# Patient Record
Sex: Male | Born: 2000 | Race: Black or African American | Hispanic: No | Marital: Single
Health system: Southern US, Community
[De-identification: ages and names within clinical notes are randomized; demographics above are authoritative.]

## PROBLEM LIST (undated history)

## (undated) DIAGNOSIS — J45909 Unspecified asthma, uncomplicated: Secondary | ICD-10-CM

## (undated) DIAGNOSIS — F909 Attention-deficit hyperactivity disorder, unspecified type: Secondary | ICD-10-CM

---
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
## 2021-10-20 ENCOUNTER — Ambulatory Visit (HOSPITAL_COMMUNITY)
Admission: EM | Admit: 2021-10-20 | Discharge: 2021-10-20 | Disposition: A | Discharge status: Home / Self Care | Payer: Medicaid Other | Attending: Internal Medicine | Admitting: Internal Medicine

## 2021-10-20 ENCOUNTER — Encounter (HOSPITAL_COMMUNITY): Payer: Self-pay

## 2021-10-20 DIAGNOSIS — Z28310 Unvaccinated for covid-19: Secondary | ICD-10-CM | POA: Diagnosis not present

## 2021-10-20 DIAGNOSIS — B9789 Other viral agents as the cause of diseases classified elsewhere: Secondary | ICD-10-CM | POA: Diagnosis not present

## 2021-10-20 DIAGNOSIS — R519 Headache, unspecified: Secondary | ICD-10-CM | POA: Diagnosis not present

## 2021-10-20 DIAGNOSIS — Z20822 Contact with and (suspected) exposure to covid-19: Secondary | ICD-10-CM | POA: Diagnosis not present

## 2021-10-20 DIAGNOSIS — R0981 Nasal congestion: Secondary | ICD-10-CM | POA: Diagnosis not present

## 2021-10-20 DIAGNOSIS — J028 Acute pharyngitis due to other specified organisms: Secondary | ICD-10-CM | POA: Diagnosis not present

## 2021-10-20 DIAGNOSIS — J029 Acute pharyngitis, unspecified: Secondary | ICD-10-CM

## 2021-10-20 LAB — POCT RAPID STREP A, ED / UC: Streptococcus, Group A Screen (Direct): NEGATIVE

## 2021-10-20 MED ORDER — IBUPROFEN 600 MG PO TABS: 600.0000 mg | ORAL_TABLET | Freq: Four times a day (QID) | ORAL | 0 refills | Status: AC | PRN

## 2021-10-20 MED ORDER — ACETAMINOPHEN 500 MG PO TABS: 1000.0000 mg | ORAL_TABLET | Freq: Four times a day (QID) | ORAL | 0 refills | Status: AC | PRN

## 2021-10-20 MED ORDER — ACETAMINOPHEN 325 MG PO TABS
650.0000 mg | ORAL_TABLET | Freq: Once | ORAL | Status: AC
  Administered 2021-10-20: 650 mg via ORAL

## 2021-10-20 MED ORDER — GUAIFENESIN ER 1200 MG PO TB12
1200.0000 mg | ORAL_TABLET | Freq: Two times a day (BID) | ORAL | 0 refills | Status: AC
Start: 2021-10-20 — End: ?

## 2021-10-20 MED ORDER — ACETAMINOPHEN 325 MG PO TABS
ORAL_TABLET | ORAL | Status: AC
  Filled 2021-10-20: qty 2

## 2021-10-20 MED ORDER — IBUPROFEN 800 MG PO TABS
ORAL_TABLET | ORAL | Status: AC
  Filled 2021-10-20: qty 1

## 2021-10-20 MED ORDER — IBUPROFEN 800 MG PO TABS
800.0000 mg | ORAL_TABLET | Freq: Once | ORAL | Status: AC
  Administered 2021-10-20: 800 mg via ORAL

## 2021-10-20 NOTE — Discharge Instructions (Signed)
You have a viral upper respiratory infection.  COVID-19 testing is pending. Group A strep testing is negative.  We will call you if your throat culture grows any bacteria requiring antibiotics.   Take guaifenesin 1200mg   2 times daily to thin your mucous so that you can cough it up and blow it out of your nose easier. Drink plenty of water while taking this medication so that it works well in your body (at least 8 cups a day).   You may take tylenol 1,000mg  and ibuprofen 600mg  every 6 hours with food as needed for fever/chills, sore throat, aches/pains, and inflammation associated with viral illness. Take this with food to avoid stomach upset.    Your next dose of tylenol may be in 6 hours at 4pm today if needed.  Your next dose of ibuprofen may be at 7pm today if needed.   You may do salt water and baking soda gargles every 4 hours as needed for your throat pain.  Please put 1 teaspoon of salt and 1/2 teaspoon of baking soda in 8 ounces of warm water then gargle and spit the water out. You may also put 1 tablespoon of honey in warm water and drink this to soothe your throat.  Place a humidifier in your room at night to help decrease dry air that can irritate your airway and cause you to have a sore throat and cough.  Please try to eat a well-balanced diet while you are sick so that your body gets proper nutrition to heal.  If you develop any new or worsening symptoms, please return.  If your symptoms are severe, please go to the emergency room.  Follow-up with your primary care provider for further evaluation and management of your symptoms as well as ongoing wellness visits.  I hope you feel better!

## 2021-10-20 NOTE — ED Triage Notes (Signed)
Pt presents with c/o HA, sore throat, fever, and nausea x 3 days.

## 2021-10-20 NOTE — ED Provider Notes (Signed)
[facility]    CSN: [id number] Arrival date & time: 10/20/21  0911      History   Chief Complaint Chief Complaint  Patient presents with   Sore Throat   Headache    HPI Jeffery Li is a 21 y.o. male.   Patient presents to urgent care for evaluation of sore throat, headache, stuffy nose, and sore throat over the last 3 days. Denies documented fever at home, but reports cold chills. Headache is generalized and at an 8 on a scale of 0-10 at this time.  Describes headache as a throbbing sensation to the generalized head.  Patient has attempted use of over-the-counter cold and flu medications and Robitussin without much relief of symptoms.  Last dose of Tylenol/ibuprofen was yesterday.  Patient is not vaccinated against COVID-19.  He is not a smoker and denies drug use.  He denies known sick contacts.  No shortness of breath, chest pain, blurry vision, decreased visual acuity, dizziness, ear pain, neck pain, cough, abdominal pain, urinary symptoms, nausea, vomiting, diarrhea, constipation, or lightheadedness reported.  He states that he has been drinking a lot of Kool-Aid and some water.  No other aggravating or relieving factors identified at this time for patient's symptoms.   Sore Throat Associated symptoms include headaches.  Headache   Past Medical History:  Diagnosis Date   ADHD    Asthma     There are no problems to display for this patient.   History reviewed. No pertinent surgical history.     Home Medications    Prior to Admission medications   Medication Sig Start Date End Date Taking? Authorizing Provider  acetaminophen (TYLENOL) 500 MG tablet Take 2 tablets (1,000 mg total) by mouth every 6 (six) hours as needed. 10/20/21  Yes Carlisle Beers, FNP  Guaifenesin 1200 MG TB12 Take 1 tablet (1,200 mg total) by mouth in the morning and at bedtime. 10/20/21  Yes Carlisle Beers, FNP  ibuprofen (ADVIL) 600 MG tablet Take 1 tablet (600 mg total) by  mouth every 6 (six) hours as needed. 10/20/21  Yes Carlisle Beers, FNP  albuterol (VENTOLIN HFA) 108 (90 Base) MCG/ACT inhaler Inhale 1-2 puffs into the lungs every 6 (six) hours as needed for wheezing or shortness of breath. Patient not taking: Reported on 10/20/2021 11/06/18   Wallis Bamberg, PA-C  benzonatate (TESSALON) 100 MG capsule Take 1-2 capsules (100-200 mg total) by mouth 3 (three) times daily as needed. Patient not taking: Reported on 09/11/2019 11/06/18   Wallis Bamberg, PA-C  Cetirizine HCl 10 MG CAPS Take 1 capsule (10 mg total) by mouth daily for 10 days. Patient not taking: Reported on 10/20/2021 09/11/19 09/21/19  Wieters, Junius Creamer, PA-C  Melatonin ([facility] MELATONIN) 10 MG TBDP Take 1 tablet by mouth at bedtime. Patient not taking: Reported on 10/20/2021    [provider]  naproxen (NAPROSYN) 375 MG tablet Take 1 tablet (375 mg total) by mouth 2 (two) times daily. Patient not taking: Reported on 10/20/2021 03/17/18   Wurst, [place], PA-C  predniSONE (DELTASONE) 10 MG tablet Begin with 6 tabs on day 1, 5 tab on day 2, 4 tab on day 3, 3 tab on day 4, 2 tab on day 5, 1 tab on day 6-take with food Patient not taking: Reported on 10/20/2021 09/11/19   Wieters, Hallie C, PA-C  triamcinolone cream (KENALOG) 0.1 % Apply 1 application topically 2 (two) times daily. Patient not taking: Reported on 10/20/2021 09/11/19   Sharyon Cable,  Hallie C, PA-C  VYVANSE 40 MG capsule Take 40 mg by mouth every morning. Patient not taking: Reported on 10/20/2021 04/11/16   [provider]  ABILIFY 5 MG tablet Take 5 mg by mouth every evening. 05/26/14 11/06/18  [provider]    Family History Family History  Family history unknown: Yes    Social History Social History   Tobacco Use   Smoking status: Never   Smokeless tobacco: Never  Vaping Use   Vaping Use: Never used  Substance Use Topics   Drug use: Never     Allergies   Patient has no known allergies.   Review of Systems Review of  Systems  Neurological:  Positive for headaches.  Per HPI   Physical Exam Triage Vital Signs ED Triage Vitals  Enc Vitals Group     BP 10/20/21 0930 139/75     Pulse Rate 10/20/21 0930 (!) 111     Resp 10/20/21 0930 14     Temp 10/20/21 0930 99.6 F (37.6 C)     Temp Source 10/20/21 0930 Oral     SpO2 10/20/21 0930 99 %     Weight --      Height --      Head Circumference --      Peak Flow --      Pain Score 10/20/21 0928 9     Pain Loc --      Pain Edu? --      Excl. in GC? --    No data found.  Updated Vital Signs BP 139/75 (BP Location: Right Arm)   Pulse (!) 111   Temp 99.6 F (37.6 C) (Oral)   Resp 14   SpO2 99%   Visual Acuity Right Eye Distance:   Left Eye Distance:   Bilateral Distance:    Right Eye Near:   Left Eye Near:    Bilateral Near:     Physical Exam Vitals and nursing note reviewed.  Constitutional:      Appearance: Normal appearance. He is ill-appearing. He is not toxic-appearing.     Comments: Very pleasant patient sitting on exam in position of comfort table in no acute distress.   HENT:     Head: Normocephalic and atraumatic.     Right Ear: Hearing, tympanic membrane, ear canal and external ear normal.     Left Ear: Hearing, tympanic membrane, ear canal and external ear normal.     Nose: Congestion present.     Mouth/Throat:     Lips: Pink.     Mouth: Mucous membranes are moist.     Pharynx: Oropharyngeal exudate and posterior oropharyngeal erythema present.     Tonsils: Tonsillar exudate present. No tonsillar abscesses.     Comments: Patchy white exudate present to bilateral tonsils.  Airway is intact.  Mild postnasal drainage present to the posterior oropharynx that is thin and clear. Eyes:     General: Lids are normal. Vision grossly intact. Gaze aligned appropriately.     Extraocular Movements: Extraocular movements intact.     Conjunctiva/sclera: Conjunctivae normal.  Cardiovascular:     Rate and Rhythm: Normal rate and regular  rhythm.     Heart sounds: Normal heart sounds, S1 normal and S2 normal.  Pulmonary:     Effort: Pulmonary effort is normal. No respiratory distress.     Breath sounds: Normal breath sounds and air entry.     Comments: Clear to auscultation bilaterally. Abdominal:     General: Bowel sounds are  normal.     Palpations: Abdomen is soft.     Tenderness: There is no abdominal tenderness. There is no right CVA tenderness, left CVA tenderness or guarding.  Musculoskeletal:     Cervical back: Neck supple.  Skin:    General: Skin is warm and dry.     Capillary Refill: Capillary refill takes less than 2 seconds.     Findings: No rash.  Neurological:     General: No focal deficit present.     Mental Status: He is alert and oriented to person, place, and time. Mental status is at baseline.     Cranial Nerves: No dysarthria or facial asymmetry.     Gait: Gait is intact.     Comments: 5/5 power throughout.  No focal deficit.  Psychiatric:        Mood and Affect: Mood normal.        Speech: Speech normal.        Behavior: Behavior normal.        Thought Content: Thought content normal.        Judgment: Judgment normal.      UC Treatments / Results  Labs (all labs ordered are listed, but only abnormal results are displayed) Labs Reviewed  SARS CORONAVIRUS 2 (TAT 6-24 HRS)  CULTURE, GROUP A STREP [facility])  POCT RAPID STREP A, ED / UC    EKG   Radiology No results found.  Procedures Procedures (including critical care time)  Medications Ordered in UC Medications  acetaminophen (TYLENOL) tablet 650 mg (650 mg Oral Given 10/20/21 0937)  ibuprofen (ADVIL) tablet 800 mg (800 mg Oral Given 10/20/21 1029)    Initial Impression / Assessment and Plan / UC Course  I have reviewed the triage vital signs and the nursing notes.  Pertinent labs & imaging results that were available during my care of the patient were reviewed by me and considered in my medical decision making (see chart for  details).  1.  Viral pharyngitis and bad headache Symptomology and physical exam are consistent with acute viral pharyngitis that will resolve with rest and increase fluids.  COVID-19 testing is pending.  Group A strep testing is negative in the clinic.  Throat culture is pending.  Plan to treat with antibiotics based on throat culture.  We will manage this symptomatically with prescriptions for guaifenesin 1200 mg twice daily and Tylenol/ibuprofen every 6 hours as needed.  Patient given Tylenol in the clinic for low-grade fever as well as ibuprofen 800 mg to treat sore throat and acute headache.  He may have more ibuprofen in 8 hours and more Tylenol and 6 hours at home.  Advised patient to take these medicines with food to avoid stomach upset.  Deferred imaging based on stable cardiopulmonary exam and hemodynamically stable vital signs.  Patient provided nonpharmacologic methods of soothing sore throat and AVS including warm tea with honey and salt water/baking soda gargles every 4-6 hours.  Patient increase fluids to at least 8 cups of water per day to maintain hydration and avoid drinking Kool-Aid as this is very sugary and dehydrating.  PCP follow-up advised.  Patient agreeable with this plan.   Discussed physical exam and available lab work findings in clinic with patient.  Counseled patient regarding appropriate use of medications and potential side effects for all medications recommended or prescribed today. Discussed red flag signs and symptoms of worsening condition,when to call the PCP office, return to urgent care, and when to seek higher level of care  in the emergency department. Patient verbalizes understanding and agreement with plan. All questions answered. Patient discharged in stable condition.   Final Clinical Impressions(s) / UC Diagnoses   Final diagnoses:  Viral pharyngitis  Bad headache     Discharge Instructions      You have a viral upper respiratory infection.  COVID-19  testing is pending. Group A strep testing is negative.  We will call you if your throat culture grows any bacteria requiring antibiotics.   Take guaifenesin 1200mg   2 times daily to thin your mucous so that you can cough it up and blow it out of your nose easier. Drink plenty of water while taking this medication so that it works well in your body (at least 8 cups a day).   You may take tylenol 1,000mg  and ibuprofen 600mg  every 6 hours with food as needed for fever/chills, sore throat, aches/pains, and inflammation associated with viral illness. Take this with food to avoid stomach upset.    Your next dose of tylenol may be in 6 hours at 4pm today if needed.  Your next dose of ibuprofen may be at 7pm today if needed.   You may do salt water and baking soda gargles every 4 hours as needed for your throat pain.  Please put 1 teaspoon of salt and 1/2 teaspoon of baking soda in 8 ounces of warm water then gargle and spit the water out. You may also put 1 tablespoon of honey in warm water and drink this to soothe your throat.  Place a humidifier in your room at night to help decrease dry air that can irritate your airway and cause you to have a sore throat and cough.  Please try to eat a well-balanced diet while you are sick so that your body gets proper nutrition to heal.  If you develop any new or worsening symptoms, please return.  If your symptoms are severe, please go to the emergency room.  Follow-up with your primary care provider for further evaluation and management of your symptoms as well as ongoing wellness visits.  I hope you feel better!      ED Prescriptions     Medication Sig Dispense Auth. Provider   Guaifenesin 1200 MG TB12 Take 1 tablet (1,200 mg total) by mouth in the morning and at bedtime. 14 tablet M, FNP   acetaminophen (TYLENOL) 500 MG tablet Take 2 tablets (1,000 mg total) by mouth every 6 (six) hours as needed. 30 tablet Reita May M, FNP    ibuprofen (ADVIL) 600 MG tablet Take 1 tablet (600 mg total) by mouth every 6 (six) hours as needed. 30 tablet Reita May, FNP      PDMP not reviewed this encounter.   M, Carlisle Beers 10/20/21 1030

## 2021-10-21 LAB — SARS CORONAVIRUS 2 (TAT 6-24 HRS): SARS Coronavirus 2: NEGATIVE

## 2021-10-22 LAB — CULTURE, GROUP A STREP (THRC)

## 2021-12-14 ENCOUNTER — Emergency Department (HOSPITAL_COMMUNITY)
Admission: EM | Admit: 2021-12-14 | Discharge: 2022-01-12 | Disposition: E | Payer: Self-pay | Attending: Emergency Medicine | Admitting: Emergency Medicine

## 2021-12-14 DIAGNOSIS — S21112A Laceration without foreign body of left front wall of thorax without penetration into thoracic cavity, initial encounter: Secondary | ICD-10-CM

## 2021-12-14 DIAGNOSIS — S21102A Unspecified open wound of left front wall of thorax without penetration into thoracic cavity, initial encounter: Secondary | ICD-10-CM | POA: Insufficient documentation

## 2021-12-14 DIAGNOSIS — I469 Cardiac arrest, cause unspecified: Secondary | ICD-10-CM | POA: Diagnosis not present

## 2021-12-14 DIAGNOSIS — S299XXA Unspecified injury of thorax, initial encounter: Secondary | ICD-10-CM | POA: Diagnosis present

## 2021-12-14 MED ORDER — EPINEPHRINE 1 MG/10ML IJ SOSY
PREFILLED_SYRINGE | INTRAMUSCULAR | Status: AC | PRN
  Administered 2021-12-14 (×3): 1 mg via INTRAVENOUS

## 2021-12-15 LAB — PREPARE FRESH FROZEN PLASMA
Unit division: 0
Unit division: 0

## 2021-12-15 LAB — BPAM FFP
Blood Product Expiration Date: 202310092359
Blood Product Expiration Date: 202310092359
ISSUE DATE / TIME: 202310031729
ISSUE DATE / TIME: 202310031729
Unit Type and Rh: 6200
Unit Type and Rh: 6200

## 2021-12-16 LAB — BPAM RBC
Blood Product Expiration Date: 202311052359
Blood Product Expiration Date: 202311052359
ISSUE DATE / TIME: 202310031721
ISSUE DATE / TIME: 202310031722
Unit Type and Rh: 5100
Unit Type and Rh: 5100

## 2021-12-16 LAB — TYPE AND SCREEN
Unit division: 0
Unit division: 0

## 2022-01-12 NOTE — Progress Notes (Signed)
Patient had king airway in place upon arrival to ED.  King airway was removed and ETT was placed by ED MD without complications at 1583.

## 2022-01-12 NOTE — ED Notes (Signed)
Pulse check.   

## 2022-01-12 NOTE — ED Notes (Signed)
Pulse check, asystole 

## 2022-01-12 NOTE — ED Notes (Signed)
Compressions resumed
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
## 2022-01-12 NOTE — ED Notes (Signed)
1st unit of RBCs started
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
## 2022-01-12 NOTE — ED Notes (Signed)
2nd unit of RBCs started, did not finish before time of death
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
## 2022-01-12 NOTE — ED Notes (Signed)
Central line to left subclavian and chest tube to left side placed during code.

## 2022-01-12 NOTE — ED Notes (Signed)
Patient time of death occurred at 1730.  
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
## 2022-01-12 DEATH — deceased
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
